# Patient Record
Sex: Female | Born: 1995 | Race: Black or African American | Hispanic: No | Marital: Single | Smoking: Never smoker
Health system: Southern US, Community
[De-identification: ages and names within clinical notes are randomized; demographics above are authoritative.]

---

## 2015-01-07 ENCOUNTER — Emergency Department (HOSPITAL_BASED_OUTPATIENT_CLINIC_OR_DEPARTMENT_OTHER)
Admission: EM | Admit: 2015-01-07 | Discharge: 2015-01-07 | Disposition: A | Discharge status: Home / Self Care | Payer: Medicaid Other | Attending: Emergency Medicine | Admitting: Emergency Medicine

## 2015-01-07 ENCOUNTER — Emergency Department (HOSPITAL_BASED_OUTPATIENT_CLINIC_OR_DEPARTMENT_OTHER): Payer: Medicaid Other

## 2015-01-07 ENCOUNTER — Encounter (HOSPITAL_BASED_OUTPATIENT_CLINIC_OR_DEPARTMENT_OTHER): Payer: Self-pay

## 2015-01-07 DIAGNOSIS — S199XXA Unspecified injury of neck, initial encounter: Secondary | ICD-10-CM | POA: Diagnosis not present

## 2015-01-07 DIAGNOSIS — Y9389 Activity, other specified: Secondary | ICD-10-CM | POA: Insufficient documentation

## 2015-01-07 DIAGNOSIS — M542 Cervicalgia: Secondary | ICD-10-CM

## 2015-01-07 DIAGNOSIS — S3992XA Unspecified injury of lower back, initial encounter: Secondary | ICD-10-CM | POA: Diagnosis not present

## 2015-01-07 DIAGNOSIS — S00531A Contusion of lip, initial encounter: Secondary | ICD-10-CM | POA: Insufficient documentation

## 2015-01-07 DIAGNOSIS — Y92410 Unspecified street and highway as the place of occurrence of the external cause: Secondary | ICD-10-CM | POA: Diagnosis not present

## 2015-01-07 DIAGNOSIS — S00532A Contusion of oral cavity, initial encounter: Secondary | ICD-10-CM | POA: Insufficient documentation

## 2015-01-07 DIAGNOSIS — S0993XA Unspecified injury of face, initial encounter: Secondary | ICD-10-CM | POA: Diagnosis present

## 2015-01-07 DIAGNOSIS — Y998 Other external cause status: Secondary | ICD-10-CM | POA: Insufficient documentation

## 2015-01-07 MED ORDER — ACETAMINOPHEN 325 MG PO TABS
650.0000 mg | ORAL_TABLET | Freq: Once | ORAL | Status: AC
  Administered 2015-01-07: 650 mg via ORAL
  Filled 2015-01-07: qty 2

## 2015-01-07 MED ORDER — OXYCODONE-ACETAMINOPHEN 5-325 MG PO TABS: 1.0000 | ORAL_TABLET | ORAL | Status: AC | PRN

## 2015-01-07 MED ORDER — IBUPROFEN 800 MG PO TABS
800.0000 mg | ORAL_TABLET | Freq: Once | ORAL | Status: AC
  Administered 2015-01-07: 800 mg via ORAL
  Filled 2015-01-07: qty 1

## 2015-01-07 MED ORDER — OXYCODONE-ACETAMINOPHEN 5-325 MG PO TABS: 2.0000 | ORAL_TABLET | Freq: Once | ORAL | Status: DC

## 2015-01-07 MED ORDER — TETANUS-DIPHTH-ACELL PERTUSSIS 5-2.5-18.5 LF-MCG/0.5 IM SUSP
0.5000 mL | Freq: Once | INTRAMUSCULAR | Status: DC
  Filled 2015-01-07: qty 0.5
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
## 2015-01-07 NOTE — Discharge Instructions (Signed)

## 2015-09-03 ENCOUNTER — Inpatient Hospital Stay (HOSPITAL_COMMUNITY)
Admission: AD | Admit: 2015-09-03 | Discharge: 2015-09-03 | Disposition: A | Discharge status: Home / Self Care | Payer: Medicaid Other | Source: Ambulatory Visit | Attending: Obstetrics & Gynecology | Admitting: Obstetrics & Gynecology

## 2015-09-03 ENCOUNTER — Encounter (HOSPITAL_COMMUNITY): Payer: Self-pay | Admitting: *Deleted

## 2015-09-03 DIAGNOSIS — T192XXA Foreign body in vulva and vagina, initial encounter: Secondary | ICD-10-CM | POA: Diagnosis not present

## 2015-09-03 DIAGNOSIS — X58XXXA Exposure to other specified factors, initial encounter: Secondary | ICD-10-CM | POA: Insufficient documentation

## 2015-09-03 NOTE — MAU Provider Note (Signed)
  History     CSN: [id number]  Arrival date and time: 09/03/15 2118   First Provider Initiated Contact with Patient 09/03/15 2200      Chief Complaint  Patient presents with  . Foreign Body in Vagina   HPI Ms. Stefano Gaulakeya Watkin is a 20 y.o. G0P0000 who presents to MAU today with complaint of possible retained tampon x 2-3 days. The patient denies fever, abdominal pain, vaginal bleeding, discharge or odor. She states that she can feel the tampon but is unable to reach it to remove it herself.   OB History    Gravida Para Term Preterm AB TAB SAB Ectopic Multiple Living   0 0 0 0 0 0 0 0 0 0       History reviewed. No pertinent past medical history.  History reviewed. No pertinent past surgical history.  History reviewed. No pertinent family history.  Social History  Substance Use Topics  . Smoking status: Never Smoker   . Smokeless tobacco: None  . Alcohol Use: No    Allergies: No Known Allergies  Prescriptions prior to admission  Medication Sig Dispense Refill Last Dose  . oxyCODONE-acetaminophen (PERCOCET/ROXICET) 5-325 MG tablet Take 1 tablet by mouth every 4 (four) hours as needed for severe pain. 6 tablet 0     Review of Systems  Constitutional: Negative for fever.  Gastrointestinal: Negative for abdominal pain.  Genitourinary:       Neg - vaginal bleeding, discharge, odor   Physical Exam   Blood pressure 121/75, pulse 51, temperature 98.1 F (36.7 C), temperature source Oral, resp. rate 16, last menstrual period 08/28/2015, SpO2 100 %.  Physical Exam  Nursing note and vitals reviewed. Constitutional: She is oriented to person, place, and time. She appears well-developed and well-nourished. No distress.  HENT:  Head: Normocephalic and atraumatic.  Cardiovascular: Normal rate.   Respiratory: Effort normal.  GI: Soft. She exhibits no distension.  Genitourinary: Cervix exhibits no friability. No bleeding in the vagina. There is a foreign body (tampon found and  removed intact without complication) in the vagina.  Neurological: She is alert and oriented to person, place, and time.  Skin: Skin is warm and dry. No erythema.  Psychiatric: She has a normal mood and affect.    MAU Course  Procedures None  MDM Tampon removed without difficulty. Patient stable and without additional complaints.   Assessment and Plan  A: Retained tampon, removed  P: Discharge home Warning signs for worsening condition discussed Patient advised to follow-up with [facility] for STD testing and birth control counseling if desired Patient may return to MAU as needed or if her condition were to change or worsen   Marny LowensteinJulie N Corneshia Hines, PA-C  09/03/2015, 10:13 PM

## 2015-09-03 NOTE — MAU Note (Signed)
Pt reports she thinks she has a tampon stuck, ? Has been in 2-3 days.

## 2015-09-03 NOTE — Discharge Instructions (Signed)
Vaginal Foreign Body °A vaginal foreign body is any object that gets stuck or left inside the vagina. This can cause: °· Bleeding. °· Itching. °· Pain. °· Swelling. °· Rash. °In most cases, symptoms go away once the object is found and taken out. Rarely, an object can break through the walls of the vagina and cause a serious infection. °HOME CARE °· Take all medicines as told by your doctor. °· If you were given an antibiotic medicine, finish it all even if you start to feel better. °· Do not have sex or use tampons until your doctor says it is okay. °· Do not clean the vagina with a jet of water (douche) unless told by your doctor. °· Keep all follow-up visits as told by your doctor. This is important. °GET HELP IF: °· You have a fever. °· You have belly (abdominal) pain. °· You have pain when you pee (urinate). °GET HELP RIGHT AWAY IF:  °· You have very bad belly pain. °· You have heavy bleeding or fluid coming from the vagina. °MAKE SURE YOU: °· Understand these instructions. °· Will watch your condition. °· Will get help right away if you are not doing well or get worse. °  °This information is not intended to replace advice given to you by your health care provider. Make sure you discuss any questions you have with your health care provider. °  °Document Released: 02/13/2009 Document Revised: 03/18/2014 Document Reviewed: 12/25/2012 °Elsevier Interactive Patient Education ©2016 [employer] ° °
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
## 2018-12-15 ENCOUNTER — Telehealth: Payer: Self-pay | Admitting: General Practice
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
## 2018-12-21 ENCOUNTER — Other Ambulatory Visit: Payer: Self-pay

## 2018-12-21 ENCOUNTER — Encounter (HOSPITAL_COMMUNITY): Payer: Self-pay | Admitting: Emergency Medicine

## 2018-12-21 ENCOUNTER — Emergency Department (HOSPITAL_COMMUNITY)
Admission: AD | Admit: 2018-12-21 | Discharge: 2018-12-22 | Disposition: A | Discharge status: Home / Self Care | Payer: Self-pay | Attending: Emergency Medicine | Admitting: Emergency Medicine

## 2018-12-21 DIAGNOSIS — F458 Other somatoform disorders: Secondary | ICD-10-CM | POA: Insufficient documentation

## 2018-12-21 DIAGNOSIS — R109 Unspecified abdominal pain: Secondary | ICD-10-CM | POA: Insufficient documentation

## 2018-12-21 LAB — POCT PREGNANCY, URINE: Preg Test, Ur: NEGATIVE

## 2018-12-21 MED ORDER — SODIUM CHLORIDE 0.9% FLUSH: 3.0000 mL | Freq: Once | INTRAVENOUS | Status: DC

## 2018-12-21 NOTE — MAU Note (Signed)
Patient reports bilateral lower abdominal pain that comes and goes.  She states the pain started last week.  No VB.  States she is [redacted] weeks pregnant-getting PNC at [facility].  States she doesn't know who her provider is.  + FM.

## 2018-12-21 NOTE — MAU Provider Note (Signed)
None     S [name] is a 23 y.o. G0P0000 non-pregnant female who presents to MAU today with complaint of abdominal pain s/t pregnancy.  Patient gives long grandiose story regarding pregnancy and anticipated delivery including recent US with pictures that she attempts to show provider.   O BP 129/76   Pulse 73   Temp 98.6 F (37 C)   Resp 19   Wt 90.1 kg   BMI 35.17 kg/m  Physical Exam  Constitutional: She is oriented to person, place, and time. She appears well-developed and well-nourished. No distress.  HENT:  Head: Normocephalic and atraumatic.  Eyes: Conjunctivae are normal.  Neck: Normal range of motion.  Cardiovascular: Normal rate.  Respiratory: Effort normal.  Musculoskeletal: Normal range of motion.  Neurological: She is alert and oriented to person, place, and time.  Skin: Skin is warm and dry.  Psychiatric: She has a normal mood and affect. Her behavior is normal.    A Non pregnant female Medical screening exam complete   P -Patient informed of negative pregnancy test today. -Discussed how hCG can be obtained, but that would take 2 hrs to return and if negative provider would be unable to evaluate.  -Offered transfer to [facility] for further evaluation and patient agreed. -Dr. Verner Chol contacted and informed of patient transfer.  Informed that patient c/o abdominal pain, but also showing signs of psychological instability. -Patient to be transferred to ED for evaluation. -Patient may return to MAU as needed for pregnancy related complaints.  Gavin Pound, CNM 12/21/2018 11:09 PM

## 2018-12-21 NOTE — ED Triage Notes (Signed)
Patient with abdominal pain, no nausea or vomiting with it.  She states that it has been going on for awhile.

## 2018-12-22 ENCOUNTER — Inpatient Hospital Stay (HOSPITAL_COMMUNITY)
Admission: AD | Admit: 2018-12-22 | Discharge: 2018-12-22 | Disposition: A | Discharge status: Home / Self Care | Payer: Medicaid Other | Attending: Obstetrics and Gynecology | Admitting: Obstetrics and Gynecology

## 2018-12-22 DIAGNOSIS — Z3202 Encounter for pregnancy test, result negative: Secondary | ICD-10-CM

## 2018-12-22 DIAGNOSIS — Z711 Person with feared health complaint in whom no diagnosis is made: Secondary | ICD-10-CM | POA: Insufficient documentation

## 2018-12-22 LAB — LIPASE, BLOOD: Lipase: 28 U/L (ref 11–51)

## 2018-12-22 LAB — COMPREHENSIVE METABOLIC PANEL
ALT: 15 U/L (ref 0–44)
AST: 16 U/L (ref 15–41)
Albumin: 4.2 g/dL (ref 3.5–5.0)
Alkaline Phosphatase: 58 U/L (ref 38–126)
Anion gap: 11 (ref 5–15)
BUN: 13 mg/dL (ref 6–20)
CO2: 24 mmol/L (ref 22–32)
Calcium: 9.8 mg/dL (ref 8.9–10.3)
Chloride: 104 mmol/L (ref 98–111)
Creatinine, Ser: 0.71 mg/dL (ref 0.44–1.00)
GFR calc Af Amer: >60 mL/min (ref >60)
GFR calc non Af Amer: >60 mL/min (ref >60)
Glucose, Bld: 93 mg/dL (ref 70–99)
Potassium: 4.1 mmol/L (ref 3.5–5.1)
Sodium: 139 mmol/L (ref 135–145)
Total Bilirubin: 0.4 mg/dL (ref 0.3–1.2)
Total Protein: 7.8 g/dL (ref 6.5–8.1)

## 2018-12-22 LAB — I-STAT BETA HCG BLOOD, ED (MC, WL, AP ONLY): I-stat hCG, quantitative: <5 m[IU]/mL (ref <5)

## 2018-12-22 LAB — CBC
HCT: 40 % (ref 36.0–46.0)
Hemoglobin: 11.9 g/dL — ABNORMAL LOW (ref 12.0–15.0)
MCH: 23.4 pg — ABNORMAL LOW (ref 26.0–34.0)
MCHC: 29.8 g/dL — ABNORMAL LOW (ref 30.0–36.0)
MCV: 78.6 fL — ABNORMAL LOW (ref 80.0–100.0)
Platelets: 223 10*3/uL (ref 150–400)
RBC: 5.09 MIL/uL (ref 3.87–5.11)
RDW: 13.2 % (ref 11.5–15.5)
WBC: 7.9 10*3/uL (ref 4.0–10.5)
nRBC: 0 % (ref 0.0–0.2)

## 2018-12-22 LAB — URINALYSIS, ROUTINE W REFLEX MICROSCOPIC
Bilirubin Urine: NEGATIVE
Glucose, UA: NEGATIVE mg/dL
Hgb urine dipstick: NEGATIVE
Ketones, ur: NEGATIVE mg/dL
Leukocytes,Ua: NEGATIVE
Nitrite: NEGATIVE
Protein, ur: NEGATIVE mg/dL
Specific Gravity, Urine: 1.024 (ref 1.005–1.030)
pH: 5 (ref 5.0–8.0)

## 2018-12-22 NOTE — MAU Note (Signed)
Pt has questions.  States she is full term pregnant.  Believes she has an ectopic preg,  A pregnancy not in her tube but in the abd.  She had an [place], (has pics with her) at "[facility]", the pictures show that her uterus is empty, but she believes there are pics of the baby outside or behind the uterus.  States no one has laid hands on her abd, and that she is feeling movement.  Pt did allow to 'lightly' palpate her abd, no fetal parts or baby noted.  No movement felt.  Attempted to explain to pt, that if she was truly pregnant, it would should in her blood work.  Pt is adamant and is asking about getting an [place], that that would prove - because we would see the baby.  I explained that as a nurse I can not order an [place], that it is up to the provider. Pt sent to registration desk, to be registered.

## 2018-12-22 NOTE — Discharge Instructions (Addendum)
It was our pleasure to provide your ER care today - we hope that you feel better.  Both your blood and urine pregnancy tests are negative.   Follow up with primary care doctor in the coming week.  Also follow up with behavioral health specialist in the coming week - see resource guide provided. For mental health issues and/or crisis, you may also go directly to [facility], or to [facility] - information and addresses attached.   Return to ER if worse, new symptoms, fevers, persistent vomiting, worsening or severe abdominal pain, or other concern.

## 2018-12-22 NOTE — MAU Provider Note (Signed)
First Provider Initiated Contact with Patient 12/22/18 1030      S [name] is a 23 y.o. G0P0000 non-pregnant female who presents to MAU today with complaint of abdominal ectopic pregnancy. Pt adamantly believes that she has an undiagnosed abdominal ectopic pregnancy and is requesting an abdominal ultrasound today so she can be appropriately managed. This has been well documented in additional recent notes since 12/15/2018. Of note, in Care Everywhere, patient was also seen in January 2020 for the same concern. Pt was seen in MAU last night @2244  with a negative UPT. Pt was then transferred to the ED and had an I-Stat beta performed @2357  showing hCG <5. Pt reports no SI/HI and that she lives with her stepfather. Patient was provided with a list of outpatient behavioral health options from the ED this morning after she refused a behavioral health consultation in the emergency department. Pt reports that her stepfather is coming to the hospital to pick her up.  O BP 134/75 (BP Location: Right Arm)   Pulse 76   Temp 98.5 F (36.9 C) (Oral)   Resp 20  Physical Exam  Constitutional: She is oriented to person, place, and time. She appears well-developed and well-nourished. No distress.  HENT:  Head: Normocephalic and atraumatic.  Respiratory: Effort normal.  Neurological: She is alert and oriented to person, place, and time.  Skin: She is not diaphoretic.  Psychiatric: She has a normal mood and affect.   A Non pregnant female Medical screening exam complete Pt experiencing psychological difficulties at this time, but no SI/HI With patient's permission, attempted to call phone numbers on file for patient's mother. Home number - no answer, no VM message, mailbox full. Mobile number - called and was answered, person who answered states they are not "Imelda Pillow" as per the mother's name in the patient's chart. Patient declines to call mother using her cell phone. Offered for patient's  stepfather to come and speak with [place] when he arrived, pt then states that he is no longer coming. Called and spoke with ED Provider Dr. Langston Masker @1126AM . Per Dr. Langston Masker, will not perform abdominal US, will only offer psychiatric consult and patient will likely be waiting all day. Requested that information be relayed to patient prior to transfer to set expectations. Pt made aware.  P Discharge from MAU in stable condition Pt offered transfer to the ED, but after she was told they were unlikely to do an abdominal US and that the wait times were long per the ED provider, pt refused transfer. Pt advised and encouraged multiple times to utilize list of outpatient behavioral health services given to her per the ED to discuss the situation. Patient given the option of transfer to [facility] for further evaluation or seek care in outpatient facility of choice List of options for follow-up given  Warning signs for worsening condition that would warrant emergency follow-up discussed Patient may return to MAU as needed for pregnancy related complaints  Remijio Holleran, Gerrie Nordmann, NP 12/22/2018 12:31 PM     Dr. Langston Masker - [phone]

## 2018-12-22 NOTE — ED Provider Notes (Signed)
[facility] EMERGENCY DEPARTMENT Provider Note   CSN: [id number] Arrival date & time: 12/21/18  2220     History   Chief Complaint Chief Complaint  Patient presents with  . Abdominal Pain    HPI Robin Pennington is a 23 y.o. female.     Patient c/o intermittent abdominal pain for months 'only when I move a certain way'. Lasts seconds per episode. Patient states it is related to her ectopic pregnancy. I asked why she feels she has an ectopic pregnancy, and she indicates she has done research online. Patient in general is poor historian. States normal period a few weeks ago but states that can happen with ectopic pregnancies. Denies current vaginal bleeding or discharge. States abdominal pain is gone now, but wants evaluation of her pregnancy. No nv. No wt change. Normal appetite. No fever or chills. No dysuria. No back or flank pain.   The history is provided by the patient.  Abdominal Pain Associated symptoms: no chest pain, no diarrhea, no fever, no shortness of breath, no sore throat, no vaginal bleeding, no vaginal discharge and no vomiting     History reviewed. No pertinent past medical history.  There are no active problems to display for this patient.   History reviewed. No pertinent surgical history.   OB History    Gravida  0   Para  0   Term  0   Preterm  0   AB  0   Living  0     SAB  0   TAB  0   Ectopic  0   Multiple  0   Live Births               Home Medications    Prior to Admission medications   Medication Sig Start Date End Date Taking? Authorizing Provider  oxyCODONE-acetaminophen (PERCOCET/ROXICET) 5-325 MG tablet Take 1 tablet by mouth every 4 (four) hours as needed for severe pain. 01/07/15   [name], MD    Family History No family history on file.  Social History Social History   Tobacco Use  . Smoking status: Never Smoker  Substance Use Topics  . Alcohol use: No  . Drug use: No      Allergies   Patient has no known allergies.   Review of Systems Review of Systems  Constitutional: Negative for fever.  HENT: Negative for sore throat.   Eyes: Negative for redness.  Respiratory: Negative for shortness of breath.   Cardiovascular: Negative for chest pain.  Gastrointestinal: Positive for abdominal pain. Negative for diarrhea and vomiting.  Genitourinary: Negative for flank pain, vaginal bleeding and vaginal discharge.  Musculoskeletal: Negative for back pain and neck pain.  Skin: Negative for rash.  Neurological: Negative for headaches.  Hematological: Does not bruise/bleed easily.  Psychiatric/Behavioral: Negative for confusion.     Physical Exam Updated Vital Signs BP 136/90 (BP Location: Right Arm)   Pulse 69   Temp 98.3 F (36.8 C) (Oral)   Resp 18   Ht 1.6 m (5\' 3" )   Wt 90.2 kg   SpO2 100%   BMI 35.23 kg/m   Physical Exam Vitals signs and nursing note reviewed.  Constitutional:      Appearance: Normal appearance. She is well-developed.  HENT:     Head: Atraumatic.     Nose: Nose normal.     Mouth/Throat:     Mouth: Mucous membranes are moist.  Eyes:     General: No scleral icterus.  Conjunctiva/sclera: Conjunctivae normal.  Neck:     Musculoskeletal: Normal range of motion and neck supple. No neck rigidity or muscular tenderness.     Trachea: No tracheal deviation.  Cardiovascular:     Rate and Rhythm: Normal rate and regular rhythm.     Pulses: Normal pulses.     Heart sounds: Normal heart sounds. No murmur. No friction rub. No gallop.   Pulmonary:     Effort: Pulmonary effort is normal. No respiratory distress.     Breath sounds: Normal breath sounds.  Abdominal:     General: Bowel sounds are normal. There is no distension.     Palpations: Abdomen is soft. There is no mass.     Tenderness: There is no abdominal tenderness. There is no guarding or rebound.     Hernia: No hernia is present.  Genitourinary:    Comments: No cva  tenderness.  Musculoskeletal:        General: No swelling.  Skin:    General: Skin is warm and dry.     Findings: No rash.  Neurological:     Mental Status: She is alert.     Comments: Alert, speech normal.   Psychiatric:     Comments: Patient appears to be delusional about ectopic pregnancy, which she states has been there for past year (or more). Tangential thinking. Normal mood/affect. No SI/HI.       ED Treatments / Results  Labs (all labs ordered are listed, but only abnormal results are displayed) Results for orders placed or performed during the hospital encounter of 12/21/18  Lipase, blood  Result Value Ref Range   Lipase 28 11 - 51 U/L  Comprehensive metabolic panel  Result Value Ref Range   Sodium 139 135 - 145 mmol/L   Potassium 4.1 3.5 - 5.1 mmol/L   Chloride 104 98 - 111 mmol/L   CO2 24 22 - 32 mmol/L   Glucose, Bld 93 70 - 99 mg/dL   BUN 13 6 - 20 mg/dL   Creatinine, Ser 4.780.71 0.44 - 1.00 mg/dL   Calcium 9.8 8.9 - 29.510.3 mg/dL   Total Protein 7.8 6.5 - 8.1 g/dL   Albumin 4.2 3.5 - 5.0 g/dL   AST 16 15 - 41 U/L   ALT 15 0 - 44 U/L   Alkaline Phosphatase 58 38 - 126 U/L   Total Bilirubin 0.4 0.3 - 1.2 mg/dL   GFR calc non Af Amer >60 >60 mL/min   GFR calc Af Amer >60 >60 mL/min   Anion gap 11 5 - 15  CBC  Result Value Ref Range   WBC 7.9 4.0 - 10.5 K/uL   RBC 5.09 3.87 - 5.11 MIL/uL   Hemoglobin 11.9 (L) 12.0 - 15.0 g/dL   HCT 62.140.0 30.836.0 - 65.746.0 %   MCV 78.6 (L) 80.0 - 100.0 fL   MCH 23.4 (L) 26.0 - 34.0 pg   MCHC 29.8 (L) 30.0 - 36.0 g/dL   RDW 84.613.2 96.211.5 - 95.215.5 %   Platelets 223 150 - 400 K/uL   nRBC 0.0 0.0 - 0.2 %  Urinalysis, Routine w reflex microscopic  Result Value Ref Range   Color, Urine YELLOW YELLOW   APPearance CLEAR CLEAR   Specific Gravity, Urine 1.024 1.005 - 1.030   pH 5.0 5.0 - 8.0   Glucose, UA NEGATIVE NEGATIVE mg/dL   Hgb urine dipstick NEGATIVE NEGATIVE   Bilirubin Urine NEGATIVE NEGATIVE   Ketones, ur NEGATIVE NEGATIVE mg/dL    Protein,  ur NEGATIVE NEGATIVE mg/dL   Nitrite NEGATIVE NEGATIVE   Leukocytes,Ua NEGATIVE NEGATIVE  Pregnancy, urine POC  Result Value Ref Range   Preg Test, Ur NEGATIVE NEGATIVE  I-Stat beta hCG blood, ED  Result Value Ref Range   I-stat hCG, quantitative <5.0 <5 mIU/mL   Comment 3           EKG None  Radiology No results found.  Procedures Procedures (including critical care time)  Medications Ordered in ED Medications  sodium chloride flush (NS) 0.9 % injection 3 mL (has no administration in time range)     Initial Impression / Assessment and Plan / ED Course  I have reviewed the triage vital signs and the nursing notes.  Pertinent labs & imaging results that were available during my care of the patient were reviewed by me and considered in my medical decision making (see chart for details).  Labs sent.   Long delay in waiting room due to boarding of admitted patients - acknowledged and apologized for long wait in waiting room.  Labs reviewed by me - preg neg. Discussed labs with pt.   Reviewed nursing notes and prior charts for additional history. On reviewed prior CareEverywhere chart, pt with visit to outside facility 03/2018 with similar delusional thoughts of pregnancy - pt was encouraged to f/u pcp and bh then. Discussed w pt - patient is not agreeable to mental health eval currently. She is alert, oriented x 3. She has normal mood/affect and denies any thoughts of harm to self or others. She remains delusional about pregnancy, and has images/photoalbum from [facility] u/s and insists I review it - no fetus seen, discussed w pt. Despite not being agreeable to bh eval currently or in ED, I encouraged her to f/u as outpt and will provide resource guide as well. Also recommend pcp f/u.   Return precautions provided.     Final Clinical Impressions(s) / ED Diagnoses   Final diagnoses:  None    ED Discharge Orders    None       Lajean Saver, MD 12/22/18 [phone]

## 2018-12-22 NOTE — ED Notes (Signed)
Pt verbalized understanding of d/c instructions. Pt tried to show this RN [place] photos of "my baby" Pt reassured that she is not pregnant multiple times. Given follow up with psychiatric services. VSS, NAD.

## 2018-12-28 ENCOUNTER — Telehealth: Payer: Self-pay

## 2018-12-28 NOTE — Telephone Encounter (Signed)
Returned pt's call that was requested from the front office.  Pt informed me that she felt that she has been helped as she should.   Pt reports that she went to [facility] for [place] pics and that they really did not get good pics. Per chart review pt's urine pregnancy test is negative and HcG is <5.  Pt reports that she wants to do a an [place].  I explained to the pt that due to her insurance not covering it will be an expense to her and that I recommend that she goes to her PCP for f/u.  Pt reports that that she went to her PCP appt about a week ago and did not get much help with her situation either.  I informed pt that because of the negative pregnancy results the next step for her would be to f/w with her PCP for abdominal pain.  Pt explains to me that she is well educated in college and looked up that there are seven types of ectopic pregnancies.  Pt states that she has the one that the pregnancy is outside of her uterus.  I explained to the pt that because her hcg levels are < 5 then there is no pregnancy in the body.  I strongly encouraged pt that she will need to f/u with her PCP so that her abdominal pain can be managed.  Pt verbalized understanding with no further questions.

## 2019-01-08 ENCOUNTER — Emergency Department (HOSPITAL_BASED_OUTPATIENT_CLINIC_OR_DEPARTMENT_OTHER)
Admission: EM | Admit: 2019-01-08 | Discharge: 2019-01-09 | Disposition: A | Discharge status: Home / Self Care | Payer: Medicaid Other | Attending: Emergency Medicine | Admitting: Emergency Medicine

## 2019-01-08 ENCOUNTER — Encounter (HOSPITAL_BASED_OUTPATIENT_CLINIC_OR_DEPARTMENT_OTHER): Payer: Self-pay | Admitting: *Deleted

## 2019-01-08 ENCOUNTER — Other Ambulatory Visit: Payer: Self-pay

## 2019-01-08 DIAGNOSIS — F22 Delusional disorders: Secondary | ICD-10-CM | POA: Insufficient documentation

## 2019-01-08 DIAGNOSIS — M545 Low back pain: Secondary | ICD-10-CM | POA: Insufficient documentation

## 2019-01-08 DIAGNOSIS — Z046 Encounter for general psychiatric examination, requested by authority: Secondary | ICD-10-CM | POA: Insufficient documentation

## 2019-01-08 DIAGNOSIS — Z5329 Procedure and treatment not carried out because of patient's decision for other reasons: Secondary | ICD-10-CM | POA: Insufficient documentation

## 2019-01-08 LAB — URINALYSIS, ROUTINE W REFLEX MICROSCOPIC
Bilirubin Urine: NEGATIVE
Glucose, UA: NEGATIVE mg/dL
Hgb urine dipstick: NEGATIVE
Ketones, ur: 15 mg/dL — AB
Leukocytes,Ua: NEGATIVE
Nitrite: NEGATIVE
Protein, ur: NEGATIVE mg/dL
Specific Gravity, Urine: 1.025 (ref 1.005–1.030)
pH: 6 (ref 5.0–8.0)

## 2019-01-08 LAB — CBC WITH DIFFERENTIAL/PLATELET
Abs Immature Granulocytes: 0.02 10*3/uL (ref 0.00–0.07)
Basophils Absolute: 0 10*3/uL (ref 0.0–0.1)
Basophils Relative: 0 %
Eosinophils Absolute: 0 10*3/uL (ref 0.0–0.5)
Eosinophils Relative: 0 %
HCT: 41.3 % (ref 36.0–46.0)
Hemoglobin: 12.4 g/dL (ref 12.0–15.0)
Immature Granulocytes: 0 %
Lymphocytes Relative: 25 %
Lymphs Abs: 2.4 10*3/uL (ref 0.7–4.0)
MCH: 23.3 pg — ABNORMAL LOW (ref 26.0–34.0)
MCHC: 30 g/dL (ref 30.0–36.0)
MCV: 77.6 fL — ABNORMAL LOW (ref 80.0–100.0)
Monocytes Absolute: 0.4 10*3/uL (ref 0.1–1.0)
Monocytes Relative: 4 %
Neutro Abs: 6.7 10*3/uL (ref 1.7–7.7)
Neutrophils Relative %: 71 %
Platelets: 246 10*3/uL (ref 150–400)
RBC: 5.32 MIL/uL — ABNORMAL HIGH (ref 3.87–5.11)
RDW: 13.5 % (ref 11.5–15.5)
WBC: 9.7 10*3/uL (ref 4.0–10.5)
nRBC: 0 % (ref 0.0–0.2)

## 2019-01-08 LAB — COMPREHENSIVE METABOLIC PANEL
ALT: 14 U/L (ref 0–44)
AST: 17 U/L (ref 15–41)
Albumin: 4.7 g/dL (ref 3.5–5.0)
Alkaline Phosphatase: 65 U/L (ref 38–126)
Anion gap: 10 (ref 5–15)
BUN: 14 mg/dL (ref 6–20)
CO2: 24 mmol/L (ref 22–32)
Calcium: 9.5 mg/dL (ref 8.9–10.3)
Chloride: 105 mmol/L (ref 98–111)
Creatinine, Ser: 0.84 mg/dL (ref 0.44–1.00)
GFR calc Af Amer: >60 mL/min (ref >60)
GFR calc non Af Amer: >60 mL/min (ref >60)
Glucose, Bld: 108 mg/dL — ABNORMAL HIGH (ref 70–99)
Potassium: 3.6 mmol/L (ref 3.5–5.1)
Sodium: 139 mmol/L (ref 135–145)
Total Bilirubin: 0.3 mg/dL (ref 0.3–1.2)
Total Protein: 8.5 g/dL — ABNORMAL HIGH (ref 6.5–8.1)

## 2019-01-08 LAB — PREGNANCY, URINE: Preg Test, Ur: NEGATIVE

## 2019-01-08 NOTE — ED Provider Notes (Signed)
[facility] EMERGENCY DEPARTMENT Provider Note   CSN: [id number] Arrival date & time: 01/08/19  2000     History   Chief Complaint Chief Complaint  Patient presents with  . Abdominal Pain    HPI Maddeline Roorda is a 23 y.o. female.     The history is provided by the patient and medical records. No language interpreter was used.  Abdominal Pain  Liridona Mashaw is a 23 y.o. female who presents to the Emergency Department complaining of abdominal pain.  She developed pain in her lower abdomen over the last few months, worsening over the last three weeks.  Pain is located throughout her lower abdomen and up to her umbilicus.  It feels like rippling discomfort through her skin and abdominal wall.  Pain is significantly worse with any sort of movement (walking, bending).  Denies fevers, N/V/constipation, vaginal discharge, dysuria.  Had a loose stool one week ago - now resolved.  She does have mild associated low back pain.  Sxs have worsened since she put on weight. She was seen a few weeks ago for similar symptoms and had negative pregnancy tests at this time. At times she states that there are babies under her skin and she feels like the are moving under her skin. She believes that she has an ectopic pregnancy that is near term.  Denies SI/HI or prior psychiatric hx.   History reviewed. No pertinent past medical history.  There are no active problems to display for this patient.   History reviewed. No pertinent surgical history.   OB History    Gravida  0   Para  0   Term  0   Preterm  0   AB  0   Living  0     SAB  0   TAB  0   Ectopic  0   Multiple  0   Live Births               Home Medications    Prior to Admission medications   Medication Sig Start Date End Date Taking? Authorizing Provider  oxyCODONE-acetaminophen (PERCOCET/ROXICET) 5-325 MG tablet Take 1 tablet by mouth every 4 (four) hours as needed for severe pain. 01/07/15   [name],  MD    Family History No family history on file.  Social History Social History   Tobacco Use  . Smoking status: Never Smoker  . Smokeless tobacco: Never Used  Substance Use Topics  . Alcohol use: No  . Drug use: No     Allergies   Patient has no known allergies.   Review of Systems Review of Systems  Gastrointestinal: Positive for abdominal pain.  All other systems reviewed and are negative.    Physical Exam Updated Vital Signs BP 116/76   Pulse 87   Temp 98.5 F (36.9 C) (Oral)   Resp 20   Ht 5\' 3"  (1.6 m)   Wt 90.2 kg   LMP 11/25/2018   SpO2 100%   BMI 35.23 kg/m   Physical Exam Vitals signs and nursing note reviewed.  Constitutional:      Appearance: She is well-developed.  HENT:     Head: Normocephalic and atraumatic.  Cardiovascular:     Rate and Rhythm: Normal rate and regular rhythm.     Heart sounds: No murmur.  Pulmonary:     Effort: Pulmonary effort is normal. No respiratory distress.     Breath sounds: Normal breath sounds.  Abdominal:  Palpations: Abdomen is soft.     Tenderness: There is no abdominal tenderness. There is no guarding or rebound.     Comments: New abdominal masses or significant appreciable tenderness. Patient refuses deep palpation as she believes this will hurt the babies.  Musculoskeletal:        General: No tenderness.  Skin:    General: Skin is warm and dry.  Neurological:     Mental Status: She is alert and oriented to person, place, and time.  Psychiatric:     Comments: Flat affect with tangential thought process. Delusions of pregnancy.      ED Treatments / Results  Labs (all labs ordered are listed, but only abnormal results are displayed) Labs Reviewed  URINALYSIS, ROUTINE W REFLEX MICROSCOPIC - Abnormal; Notable for the following components:      Result Value   Ketones, ur 15 (*)    All other components within normal limits  CBC WITH DIFFERENTIAL/PLATELET - Abnormal; Notable for the following  components:   RBC 5.32 (*)    MCV 77.6 (*)    MCH 23.3 (*)    All other components within normal limits  COMPREHENSIVE METABOLIC PANEL - Abnormal; Notable for the following components:   Glucose, Bld 108 (*)    Total Protein 8.5 (*)    All other components within normal limits  PREGNANCY, URINE    EKG None  Radiology No results found.  Procedures Procedures (including critical care time)  Medications Ordered in ED Medications - No data to display   Initial Impression / Assessment and Plan / ED Course  I have reviewed the triage vital signs and the nursing notes.  Pertinent labs & imaging results that were available during my care of the patient were reviewed by me and considered in my medical decision making (see chart for details).        Patient here for concern for ectopic pregnancy. She has had multiple negative pregnancy tests on review in Epic. She is delusional with tangential thought process. She is not currently suicidal or homicidal but concerned due to patient's escalating hospital visits for her delusions. Concern that this is significantly interacting with her quality of life. She has been medically cleared for psychiatric evaluation and treatment.  Final Clinical Impressions(s) / ED Diagnoses   Final diagnoses:  None    ED Discharge Orders    None       Quintella Reichert, MD 01/09/19 [phone]

## 2019-01-08 NOTE — ED Triage Notes (Signed)
Abdominal pain in her lower abdomen for about 3 weeks.
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
## 2019-01-08 NOTE — ED Notes (Signed)
Patient states that she feels like there are babies just under the skin. Patient is made aware that she is not pregnant at this time. Patient states that she is " not having a normal pregnancy and these babies are just under my skin and she can feel them moving"

## 2019-01-09 ENCOUNTER — Inpatient Hospital Stay (HOSPITAL_COMMUNITY)
Admission: AD | Admit: 2019-01-09 | Payer: No Typology Code available for payment source | Source: Intra-hospital | Admitting: Psychiatry

## 2019-01-09 LAB — RAPID URINE DRUG SCREEN, HOSP PERFORMED
Amphetamines: NOT DETECTED
Barbiturates: NOT DETECTED
Benzodiazepines: NOT DETECTED
Cocaine: NOT DETECTED
Opiates: NOT DETECTED
Tetrahydrocannabinol: POSITIVE — AB

## 2019-01-09 NOTE — ED Notes (Signed)
This RN and Amy, EMT into patients room to explain process for [facility] holding. Writer explained that patient would have to be wanded by security, then belongings would be taken and inventoried. Pt with questions regarding belongings, explained that belongings are inventoried and wanded and would be transported with patient when she was transferred to [facility] hospital. Pt requesting to speak with TTS again due to confusion regarding possible diagnosis. Explained to patient that she would be re-evaluated in the AM. Also explained to patient that her bed would not be available at [facility] until after 1pm on Saturday. Pt voiced concerns, requested some time to think about her decision and choice. Pt provided privacy, RN and EMT stepped out.

## 2019-01-09 NOTE — Progress Notes (Signed)
TTS asked the pt for permission to contact collateral supports and she declined stating "no one will answer." TTS reassured the pt and asked for permission to call collaterals, just in case they answer and she again declined to provide consent.   Lind Covert, MSW, LCSW Therapeutic Triage Specialist  [phone]

## 2019-01-09 NOTE — Progress Notes (Signed)
Pt left AMA per RN. AC notified bed no longer needed.  Lind Covert, MSW, LCSW Therapeutic Triage Specialist  [phone]

## 2019-01-09 NOTE — ED Notes (Signed)
Pt walked out of room, informed registration staff that she was leaving. Pt ambulatory, A&O x 4, denies suicidal and homicidal ideations. MD made aware about patient leaving.

## 2019-01-09 NOTE — Progress Notes (Addendum)
Lindon Romp, NP recommends inpt tx. Pt accepted to [facility] 508-1 to Dr. Sheppard Evens, MD pending negative Covid screening. Bed available after 1pm on 01/09/19. Call to report [phone]. EDP Dr. Randal Buba, MD and pt's nurse Luciano Cutter, RN have been advised.  Lind Covert, MSW, LCSW Therapeutic Triage Specialist  [phone]

## 2019-01-09 NOTE — ED Notes (Signed)
MD at bedside discussing plan with patient at this time.
